# Patient Record
Sex: Female | Born: 1955 | Race: Black or African American | Hispanic: No | Marital: Single | Smoking: Former smoker
Health system: Southern US, Community
[De-identification: ages and names within clinical notes are randomized; demographics above are authoritative.]

---

## 2004-04-04 ENCOUNTER — Emergency Department: Payer: Self-pay | Admitting: Emergency Medicine

## 2004-04-07 ENCOUNTER — Ambulatory Visit: Payer: Self-pay | Admitting: Emergency Medicine

## 2004-09-08 ENCOUNTER — Emergency Department: Payer: Self-pay | Admitting: Emergency Medicine

## 2005-01-24 ENCOUNTER — Ambulatory Visit: Payer: Self-pay | Admitting: Family Medicine

## 2005-08-09 ENCOUNTER — Emergency Department: Payer: Self-pay | Admitting: Emergency Medicine

## 2006-03-28 ENCOUNTER — Ambulatory Visit: Payer: Self-pay | Admitting: Family Medicine

## 2007-05-30 ENCOUNTER — Ambulatory Visit: Payer: Self-pay | Admitting: Family Medicine

## 2007-06-06 ENCOUNTER — Emergency Department: Payer: Self-pay | Admitting: Emergency Medicine

## 2007-06-12 ENCOUNTER — Emergency Department: Payer: Self-pay | Admitting: Emergency Medicine

## 2007-07-18 ENCOUNTER — Emergency Department: Payer: Self-pay | Admitting: Emergency Medicine

## 2007-12-16 ENCOUNTER — Ambulatory Visit: Payer: Self-pay | Admitting: Gastroenterology

## 2008-06-17 ENCOUNTER — Ambulatory Visit: Payer: Self-pay | Admitting: Family Medicine

## 2009-03-13 ENCOUNTER — Emergency Department: Payer: Self-pay | Admitting: Emergency Medicine

## 2009-06-22 ENCOUNTER — Emergency Department: Payer: Self-pay | Admitting: Emergency Medicine

## 2009-07-21 ENCOUNTER — Ambulatory Visit: Payer: Self-pay | Admitting: Unknown Physician Specialty

## 2009-10-08 ENCOUNTER — Emergency Department: Payer: Self-pay | Admitting: Emergency Medicine

## 2010-07-25 ENCOUNTER — Ambulatory Visit: Payer: Self-pay | Admitting: Unknown Physician Specialty

## 2011-07-31 ENCOUNTER — Ambulatory Visit: Payer: Self-pay | Admitting: Internal Medicine

## 2011-09-19 ENCOUNTER — Emergency Department: Payer: Self-pay | Admitting: *Deleted

## 2012-07-20 ENCOUNTER — Emergency Department: Payer: Self-pay | Admitting: Emergency Medicine

## 2012-08-15 ENCOUNTER — Ambulatory Visit: Payer: Self-pay | Admitting: Physician Assistant

## 2013-08-18 ENCOUNTER — Ambulatory Visit: Payer: Self-pay | Admitting: Physician Assistant

## 2014-01-10 ENCOUNTER — Emergency Department: Payer: Self-pay | Admitting: Student

## 2014-05-27 ENCOUNTER — Emergency Department: Payer: Self-pay | Admitting: Emergency Medicine

## 2014-08-21 ENCOUNTER — Ambulatory Visit
Admit: 2014-08-21 | Disposition: A | Discharge status: Home / Self Care | Payer: Self-pay | Attending: Physician Assistant | Admitting: Physician Assistant

## 2015-06-14 ENCOUNTER — Other Ambulatory Visit: Payer: Self-pay | Admitting: Physician Assistant

## 2015-08-16 ENCOUNTER — Other Ambulatory Visit: Payer: Self-pay | Admitting: Physician Assistant

## 2015-08-16 DIAGNOSIS — Z1231 Encounter for screening mammogram for malignant neoplasm of breast: Secondary | ICD-10-CM

## 2015-08-24 ENCOUNTER — Ambulatory Visit
Admission: RE | Admit: 2015-08-24 | Discharge: 2015-08-24 | Disposition: A | Discharge status: Home / Self Care | Payer: BLUE CROSS/BLUE SHIELD | Source: Ambulatory Visit | Attending: Physician Assistant | Admitting: Physician Assistant

## 2015-08-24 DIAGNOSIS — Z1231 Encounter for screening mammogram for malignant neoplasm of breast: Secondary | ICD-10-CM

## 2016-03-27 DIAGNOSIS — R7303 Prediabetes: Secondary | ICD-10-CM | POA: Insufficient documentation

## 2016-07-25 ENCOUNTER — Ambulatory Visit: Payer: Self-pay | Admitting: *Deleted

## 2016-07-25 DIAGNOSIS — Z789 Other specified health status: Secondary | ICD-10-CM

## 2016-07-26 ENCOUNTER — Ambulatory Visit: Payer: Self-pay

## 2016-08-15 ENCOUNTER — Other Ambulatory Visit: Payer: Self-pay | Admitting: Physician Assistant

## 2016-08-15 DIAGNOSIS — Z1231 Encounter for screening mammogram for malignant neoplasm of breast: Secondary | ICD-10-CM

## 2016-09-01 ENCOUNTER — Ambulatory Visit
Admission: RE | Admit: 2016-09-01 | Discharge: 2016-09-01 | Disposition: A | Discharge status: Home / Self Care | Payer: BLUE CROSS/BLUE SHIELD | Source: Ambulatory Visit | Attending: Physician Assistant | Admitting: Physician Assistant

## 2016-09-01 DIAGNOSIS — Z1231 Encounter for screening mammogram for malignant neoplasm of breast: Secondary | ICD-10-CM | POA: Insufficient documentation

## 2017-03-27 ENCOUNTER — Ambulatory Visit: Payer: Self-pay | Admitting: Medical

## 2017-03-27 ENCOUNTER — Encounter: Payer: Self-pay | Admitting: Medical

## 2017-03-27 VITALS — BP 120/84 | HR 66 | Temp 98.4°F | Resp 16 | Ht 63.0 in | Wt 194.0 lb

## 2017-03-27 DIAGNOSIS — J0100 Acute maxillary sinusitis, unspecified: Secondary | ICD-10-CM

## 2017-03-27 DIAGNOSIS — J069 Acute upper respiratory infection, unspecified: Secondary | ICD-10-CM

## 2017-03-27 DIAGNOSIS — R05 Cough: Secondary | ICD-10-CM

## 2017-03-27 DIAGNOSIS — R059 Cough, unspecified: Secondary | ICD-10-CM

## 2017-03-27 MED ORDER — AMOXICILLIN-POT CLAVULANATE 875-125 MG PO TABS: 1.0000 | ORAL_TABLET | Freq: Two times a day (BID) | ORAL | 0 refills | Status: DC

## 2017-03-27 MED ORDER — BENZONATATE 100 MG PO CAPS: 100.0000 mg | ORAL_CAPSULE | Freq: Three times a day (TID) | ORAL | 0 refills | Status: DC | PRN

## 2017-03-27 NOTE — Progress Notes (Signed)
   Subjective:    Patient ID: Charlotte Tucker, female    DOB: 1955/05/30, 61 y.o.   MRN: [id number]  HPI 61 yo female in non acute distress started Monday with initially with sore throat and both ears clogged feeling Ears better now. Cougn now non productive. Denies fever or chills, Soreness in chest from coughing. Using sore throat lozengers no relief.    History of  Hypertension Low vitamin D   Review of Systems  Constitutional: Negative for chills, fatigue and fever.  HENT: Positive for congestion and rhinorrhea. Negative for ear pain, postnasal drip, sinus pressure, sinus pain, sneezing and sore throat.   Eyes: Negative for discharge and itching.  Respiratory: Positive for cough. Negative for chest tightness and shortness of breath.   Cardiovascular: Positive for chest pain (soreness from coughing). Negative for palpitations and leg swelling.  Gastrointestinal: Negative for abdominal pain.  Endocrine: Negative for cold intolerance and heat intolerance.  Genitourinary: Negative for dysuria.  Musculoskeletal: Negative for myalgias.  Skin: Negative for rash.  Allergic/Immunologic: Negative for environmental allergies and food allergies.  Neurological: Negative for dizziness, syncope, light-headedness and headaches.  Hematological: Negative for adenopathy.  Psychiatric/Behavioral: Negative for behavioral problems, self-injury and suicidal ideas. The patient is not nervous/anxious.        Objective:   Physical Exam  Constitutional: She is oriented to person, place, and time. She appears well-developed and well-nourished.  HENT:  Head: Normocephalic and atraumatic.  Right Ear: Hearing, external ear and ear canal normal. A middle ear effusion is present.  Left Ear: Hearing, external ear and ear canal normal. A middle ear effusion is present.  Nose: Mucosal edema and rhinorrhea present. Right sinus exhibits maxillary sinus tenderness. Left sinus exhibits maxillary sinus tenderness.   Mouth/Throat: Uvula is midline, oropharynx is clear and moist and mucous membranes are normal.  Eyes: Conjunctivae and EOM are normal. Pupils are equal, round, and reactive to light.  Neck: Normal range of motion. Neck supple.  Cardiovascular: Normal rate, regular rhythm and normal heart sounds. Exam reveals no gallop and no friction rub.  No murmur heard. Pulmonary/Chest: Effort normal and breath sounds normal. No respiratory distress. She has no wheezes. She has no rales. She exhibits tenderness.  Lymphadenopathy:    She has no cervical adenopathy.  Neurological: She is alert and oriented to person, place, and time.  Skin: Skin is warm and dry.  Psychiatric: She has a normal mood and affect. Her behavior is normal. Judgment and thought content normal.  Nursing note and vitals reviewed.   turbinate edema and erythema noted left more than right.  Cough noted in room    Assessment & Plan:  Sinusitis/ Upper respiratory infection / cough Meds ordered this encounter  Medications  . amoxicillin-clavulanate (AUGMENTIN) 875-125 MG tablet    Sig: Take 1 tablet by mouth 2 (two) times daily.    Dispense:  20 tablet    Refill:  0  . benzonatate (TESSALON PERLES) 100 MG capsule    Sig: Take 1 capsule (100 mg total) by mouth 3 (three) times daily as needed for cough.    Dispense:  30 capsule    Refill:  0  OTCfFonse take as directed.  Return in 3-5 days if not doing better.  Patient verbalizes understanding and had no questions at discharges.

## 2017-03-27 NOTE — Patient Instructions (Signed)
OTC Flonase take as directed. Cough, Adult A cough helps to clear your throat and lungs. A cough may last only 2-3 weeks (acute), or it may last longer than 8 weeks (chronic). Many different things can cause a cough. A cough may be a sign of an illness or another medical condition. Follow these instructions at home:  Pay attention to any changes in your cough.  Take medicines only as told by your doctor. ? If you were prescribed an antibiotic medicine, take it as told by your doctor. Do not stop taking it even if you start to feel better. ? Talk with your doctor before you try using a cough medicine.  Drink enough fluid to keep your pee (urine) clear or pale yellow.  If the air is dry, use a cold steam vaporizer or humidifier in your home.  Stay away from things that make you cough at work or at home.  If your cough is worse at night, try using extra pillows to raise your head up higher while you sleep.  Do not smoke, and try not to be around smoke. If you need help quitting, ask your doctor.  Do not have caffeine.  Do not drink alcohol.  Rest as needed. Contact a doctor if:  You have new problems (symptoms).  You cough up yellow fluid (pus).  Your cough does not get better after 2-3 weeks, or your cough gets worse.  Medicine does not help your cough and you are not sleeping well.  You have pain that gets worse or pain that is not helped with medicine.  You have a fever.  You are losing weight and you do not know why.  You have night sweats. Get help right away if:  You cough up blood.  You have trouble breathing.  Your heartbeat is very fast. This information is not intended to replace advice given to you by your health care provider. Make sure you discuss any questions you have with your health care provider. Document Released: 12/29/2010 Document Revised: 09/23/2015 Document Reviewed: 06/24/2014 Elsevier Interactive Patient Education  2018 [employer]vier  Inc[employer]  Upper Respiratory Infection, Adult Most upper respiratory infections (URIs) are caused by a virus. A URI affects the nose, throat, and upper air passages. The most common type of URI is often called "the common cold." Follow these instructions at home:  Take medicines only as told by your doctor.  Gargle warm saltwater or take cough drops to comfort your throat as told by your doctor.  Use a warm mist humidifier or inhale steam from a shower to increase air moisture. This may make it easier to breathe.  Drink enough fluid to keep your pee (urine) clear or pale yellow.  Eat soups and other clear broths.  Have a healthy diet.  Rest as needed.  Go back to work when your fever is gone or your doctor says it is okay. ? You may need to stay home longer to avoid giving your URI to others. ? You can also wear a face mask and wash your hands often to prevent spread of the virus.  Use your inhaler more if you have asthma.  Do not use any tobacco products, including cigarettes, chewing tobacco, or electronic cigarettes. If you need help quitting, ask your doctor. Contact a doctor if:  You are getting worse, not better.  Your symptoms are not helped by medicine.  You have chills.  You are getting more short of breath.  You have brown or red  mucus.  You have yellow or brown discharge from your nose.  You have pain in your face, especially when you bend forward.  You have a fever.  You have puffy (swollen) neck glands.  You have pain while swallowing.  You have white areas in the back of your throat. Get help right away if:  You have very bad or constant: ? Headache. ? Ear pain. ? Pain in your forehead, behind your eyes, and over your cheekbones (sinus pain). ? Chest pain.  You have long-lasting (chronic) lung disease and any of the following: ? Wheezing. ? Long-lasting cough. ? Coughing up blood. ? A change in your usual mucus.  You have a stiff neck.  You  have changes in your: ? Vision. ? Hearing. ? Thinking. ? Mood. This information is not intended to replace advice given to you by your health care provider. Make sure you discuss any questions you have with your health care provider. Document Released: 10/04/2007 Document Revised: 12/19/2015 Document Reviewed: 07/23/2013 Elsevier Interactive Patient Education  2018 [employer]    Sinusitis, Adult Sinusitis is soreness and inflammation of your sinuses. Sinuses are hollow spaces in the bones around your face. They are located:  Around your eyes.  In the middle of your forehead.  Behind your nose.  In your cheekbones.  Your sinuses and nasal passages are lined with a stringy fluid (mucus). Mucus normally drains out of your sinuses. When your nasal tissues get inflamed or swollen, the mucus can get trapped or blocked so air cannot flow through your sinuses. This lets bacteria, viruses, and funguses grow, and that leads to infection. Follow these instructions at home: Medicines  Take, use, or apply over-the-counter and prescription medicines only as told by your doctor. These may include nasal sprays.  If you were prescribed an antibiotic medicine, take it as told by your doctor. Do not stop taking the antibiotic even if you start to feel better. Hydrate and Humidify  Drink enough water to keep your pee (urine) clear or pale yellow.  Use a cool mist humidifier to keep the humidity level in your home above 50%.  Breathe in steam for 10-15 minutes, 3-4 times a day or as told by your doctor. You can do this in the bathroom while a hot shower is running.  Try not to spend time in cool or dry air. Rest  Rest as much as possible.  Sleep with your head raised (elevated).  Make sure to get enough sleep each night. General instructions  Put a warm, moist washcloth on your face 3-4 times a day or as told by your doctor. This will help with discomfort.  Wash your hands often with  soap and water. If there is no soap and water, use hand sanitizer.  Do not smoke. Avoid being around people who are smoking (secondhand smoke).  Keep all follow-up visits as told by your doctor. This is important. Contact a doctor if:  You have a fever.  Your symptoms get worse.  Your symptoms do not get better within 10 days. Get help right away if:  You have a very bad headache.  You cannot stop throwing up (vomiting).  You have pain or swelling around your face or eyes.  You have trouble seeing.  You feel confused.  Your neck is stiff.  You have trouble breathing. This information is not intended to replace advice given to you by your health care provider. Make sure you discuss any questions you have with  your health care provider. Document Released: 10/04/2007 Document Revised: 12/12/2015 Document Reviewed: 02/10/2015 Elsevier Interactive Patient Education  [employer].

## 2017-06-01 ENCOUNTER — Ambulatory Visit: Payer: Self-pay | Admitting: Adult Health

## 2017-06-01 ENCOUNTER — Encounter: Payer: Self-pay | Admitting: Adult Health

## 2017-06-01 VITALS — BP 110/70 | HR 94 | Temp 102.2°F | Resp 16 | Ht 64.0 in | Wt 191.0 lb

## 2017-06-01 DIAGNOSIS — E785 Hyperlipidemia, unspecified: Secondary | ICD-10-CM | POA: Insufficient documentation

## 2017-06-01 DIAGNOSIS — E559 Vitamin D deficiency, unspecified: Secondary | ICD-10-CM | POA: Insufficient documentation

## 2017-06-01 DIAGNOSIS — R6889 Other general symptoms and signs: Secondary | ICD-10-CM

## 2017-06-01 DIAGNOSIS — E669 Obesity, unspecified: Secondary | ICD-10-CM | POA: Insufficient documentation

## 2017-06-01 DIAGNOSIS — J101 Influenza due to other identified influenza virus with other respiratory manifestations: Secondary | ICD-10-CM

## 2017-06-01 DIAGNOSIS — I10 Essential (primary) hypertension: Secondary | ICD-10-CM | POA: Insufficient documentation

## 2017-06-01 LAB — POCT INFLUENZA A/B
INFLUENZA A, POC: POSITIVE — AB
Influenza B, POC: NEGATIVE

## 2017-06-01 MED ORDER — OSELTAMIVIR PHOSPHATE 75 MG PO CAPS: 75.0000 mg | ORAL_CAPSULE | Freq: Two times a day (BID) | ORAL | 0 refills | Status: DC

## 2017-06-01 MED ORDER — AZITHROMYCIN 250 MG PO TABS: ORAL_TABLET | ORAL | 0 refills | Status: DC

## 2017-06-01 NOTE — Progress Notes (Signed)
Subjective:     Patient ID: Charlotte Tucker, female   DOB: [birth year], 62 y.o.   MRN: [id number]  HPI  Vitals:   06/01/17 0906  BP: 110/70  Pulse: 94  Resp: 16  Temp: (!) 102.2 F (39 C)  SpO2: 96%   Patient is a 62 year old female in no acute distress who presents to the clinic with fever, chills, body aches, mild headache, cough x 2 days. She reports symptoms started out mild on Wednesday afternoon.  Fever started today per patient and symptoms were very mild on onset and has increased today. She did not work last pm. She reports other than the fatigue and fever she feels fine.  Febrile in office today.   She has a non productive occasional cough per her report.  Mild body generalized body aches.   Patient  denies any chest pain, shortness of breath, nausea, vomiting, or diarrhea.  Patient Active Problem List   Diagnosis Date Noted  . Hyperlipidemia, unspecified 06/01/2017  . Hypertension 06/01/2017  . Obesity, unspecified 06/01/2017  . Vitamin D deficiency, unspecified 06/01/2017  . Prediabetes 03/27/2016      Current Outpatient Medications:  .  aspirin EC 81 MG tablet, Take two tabs daily with food for cardiovascular prophylaxis., Disp: , Rfl:  .  losartan-hydrochlorothiazide (HYZAAR) 100-12.5 MG tablet, take 1 tablet by mouth once daily, Disp: , Rfl:  .  spironolactone (ALDACTONE) 25 MG tablet, take 1 tablet by mouth once daily, Disp: , Rfl:  .  Vitamin D, Ergocalciferol, (DRISDOL) 50000 units CAPS capsule, take 1 capsule by mouth every week, Disp: , Rfl:  .  ibuprofen (ADVIL,MOTRIN) 800 MG tablet, Take by mouth., Disp: , Rfl:   Review of Systems  Constitutional: Positive for chills, fatigue and fever. Negative for activity change, appetite change, diaphoresis and unexpected weight change.  HENT: Positive for congestion, postnasal drip and sneezing. Negative for dental problem, drooling, ear discharge, ear pain, facial swelling, hearing loss, mouth sores, nosebleeds,  rhinorrhea, sinus pressure, sinus pain, sore throat, tinnitus, trouble swallowing and voice change.   Eyes: Negative.   Respiratory: Negative for apnea, cough, choking, chest tightness, shortness of breath, wheezing and stridor.   Cardiovascular: Negative for chest pain, palpitations and leg swelling.  Gastrointestinal: Negative.   Endocrine: Negative.   Genitourinary: Negative.   Musculoskeletal: Negative.        Body aches   Skin: Negative.   Allergic/Immunologic: Negative.   Neurological: Negative.   Hematological: Negative.   Psychiatric/Behavioral: Negative.        Objective:   Physical Exam  Constitutional: She is oriented to person, place, and time. She appears well-developed and well-nourished. No distress.  HENT:  Head: Normocephalic and atraumatic.  Right Ear: External ear normal. No tenderness. Tympanic membrane is erythematous. Tympanic membrane is not scarred and not perforated. No middle ear effusion. No decreased hearing is noted.  Left Ear: External ear normal. No tenderness. Tympanic membrane is erythematous (mild bilaterally ). Tympanic membrane is not scarred and not perforated.  No middle ear effusion. No decreased hearing is noted.  Nose: Rhinorrhea present. No mucosal edema. Right sinus exhibits no maxillary sinus tenderness and no frontal sinus tenderness. Left sinus exhibits no maxillary sinus tenderness and no frontal sinus tenderness.  Mouth/Throat: Uvula is midline, oropharynx is clear and moist and mucous membranes are normal. No oropharyngeal exudate, posterior oropharyngeal edema, posterior oropharyngeal erythema or tonsillar abscesses.  Eyes: Conjunctivae, EOM and lids are normal. Pupils are equal, round, and reactive  to light. Right eye exhibits no discharge. Left eye exhibits no discharge. No scleral icterus.  Neck: Trachea normal, normal range of motion, full passive range of motion without pain and phonation normal. Neck supple. No JVD present. No neck  rigidity. No tracheal deviation, no edema, no erythema and normal range of motion present. No Brudzinski's sign and no Kernig's sign noted. No thyromegaly present.  Cardiovascular: Normal rate, regular rhythm, S1 normal, S2 normal, normal heart sounds and intact distal pulses. Exam reveals no gallop and no friction rub.  No murmur heard. Pulmonary/Chest: Effort normal and breath sounds normal. No stridor. No respiratory distress. She has no wheezes. She has no rales. She exhibits no tenderness.  Abdominal: Soft. Bowel sounds are normal.  Musculoskeletal: Normal range of motion.  Lymphadenopathy:    She has no cervical adenopathy.  Neurological: She is alert and oriented to person, place, and time. She displays normal reflexes. No cranial nerve deficit. She exhibits normal muscle tone. Coordination normal.  Skin: Skin is warm and dry. No rash noted. She is not diaphoretic. No erythema. No pallor.  Psychiatric: She has a normal mood and affect. Her behavior is normal. Judgment and thought content normal.  Vitals reviewed.      Assessment:     Flu-like symptoms - Plan: POCT Influenza A/B, CBC w/Diff, Comp Met (CMET)  Influenza A - Plan: CBC w/Diff, Comp Met (CMET)      Plan:     Patient requests Tamiflu.   Orders Placed This Encounter  Procedures  . CBC w/Diff  . Comp Met (CMET)  . POCT Influenza A/B   Will check CBC and CMET for baseline labs as none available to this provider at time of visit.  Flu A rapid POCT in office is positive. Flu B negative.   FLU AVS handout given and reviewed, discussed also possible side effects of Tamiflu and when to seek attention of medical facility.   Meds ordered this encounter  Medications  . azithromycin (ZITHROMAX) 250 MG tablet    Sig: Take 2 tablets day one and then one tablet day two three four and five    Dispense:  6 tablet    Refill:  0  . oseltamivir (TAMIFLU) 75 MG capsule    Sig: Take 1 capsule (75 mg total) by mouth 2 (two)  times daily.    Dispense:  10 capsule    Refill:  0   Discussed returning if any worsening symptoms or if after hours go to emergency will like if it was fluids that may remain for that  Advised to return to clinic for an appointment if no improvement within 72 hours or if any symptoms change or worsen. Advised ER or urgent Care if after hours or on weekend. 911 for emergency symptoms at any time.    Patient verbalized understanding of all instructions given and denies any further questions at this time.

## 2017-06-01 NOTE — Patient Instructions (Signed)

## 2017-06-02 LAB — COMPREHENSIVE METABOLIC PANEL
ALT: 17 IU/L (ref 0–32)
AST: 15 IU/L (ref 0–40)
Albumin/Globulin Ratio: 1.3 (ref 1.2–2.2)
Albumin: 4 g/dL (ref 3.5–5.5)
Alkaline Phosphatase: 72 IU/L (ref 39–117)
BUN/Creatinine Ratio: 10 (ref 9–23)
BUN: 14 mg/dL (ref 6–24)
Bilirubin Total: 0.3 mg/dL (ref 0.0–1.2)
CALCIUM: 9.1 mg/dL (ref 8.7–10.2)
CO2: 24 mmol/L (ref 20–29)
CREATININE: 1.37 mg/dL — AB (ref 0.57–1.00)
Chloride: 98 mmol/L (ref 96–106)
GFR calc Af Amer: 49 mL/min/{1.73_m2} — ABNORMAL LOW (ref >59)
GFR, EST NON AFRICAN AMERICAN: 42 mL/min/{1.73_m2} — AB (ref >59)
GLOBULIN, TOTAL: 3 g/dL (ref 1.5–4.5)
Glucose: 132 mg/dL — ABNORMAL HIGH (ref 65–99)
Potassium: 4.6 mmol/L (ref 3.5–5.2)
Sodium: 136 mmol/L (ref 134–144)
Total Protein: 7 g/dL (ref 6.0–8.5)

## 2017-06-02 LAB — CBC WITH DIFFERENTIAL/PLATELET
BASOS: 0 %
Basophils Absolute: 0 10*3/uL (ref 0.0–0.2)
EOS (ABSOLUTE): 0.1 10*3/uL (ref 0.0–0.4)
EOS: 1 %
HEMATOCRIT: 37.2 % (ref 34.0–46.6)
Hemoglobin: 12.2 g/dL (ref 11.1–15.9)
IMMATURE GRANS (ABS): 0 10*3/uL (ref 0.0–0.1)
IMMATURE GRANULOCYTES: 0 %
Lymphocytes Absolute: 0.9 10*3/uL (ref 0.7–3.1)
Lymphs: 11 %
MCH: 28.8 pg (ref 26.6–33.0)
MCHC: 32.8 g/dL (ref 31.5–35.7)
MCV: 88 fL (ref 79–97)
MONOCYTES: 10 %
Monocytes Absolute: 0.8 10*3/uL (ref 0.1–0.9)
NEUTROS PCT: 78 %
Neutrophils Absolute: 6.6 10*3/uL (ref 1.4–7.0)
Platelets: 290 10*3/uL (ref 150–379)
RBC: 4.23 x10E6/uL (ref 3.77–5.28)
RDW: 15.3 % (ref 12.3–15.4)
WBC: 8.5 10*3/uL (ref 3.4–10.8)

## 2017-06-03 ENCOUNTER — Telehealth: Payer: Self-pay | Admitting: Adult Health

## 2017-06-03 NOTE — Telephone Encounter (Signed)
06/03/2017 9:02pm   Patient returned call, she reports she is feeling better than she was on Friday. She reports she did have an episode where she tripped at home oin Saturday and her daughter called EMS, she reports she declined going to the hospital and denies any injuries. She reports she is up walking around today. Patient  denies chills, rash, chest pain, shortness of breath, nausea, vomiting, or diarrhea. She denies having a fever since Saturday.  She is informed of her decreased kidney function at this time.   She is advised to drink clear liquids to keep hydrated and she verbalizes understanding to stop Tamiflu. She reports she has not taken any Tamiflu since yesterday, She is drinking lots of Gatorade per her report.   She is advise to go to the ER - have daughter drive if any fever 191103 or higher, respiratory changes, chills, pain or any other symptoms develop or worsen.  Call 911 for any emergent symptoms.  Discussed kidney function and avoiding NSAIDS. She denies any history of decreased kidney function.  She verbalizes she will stop Tamiflu.   Discussed risk factors of flu and risk for infection, she is aware of signs of worsening symptoms and will go to the ER should this occur. She reports she is eating and drinking well.   She will call the office as needed  For an appointment during regular office hours and will follow up with the office or her Charlotte Tucker, Charlotte K, MD for repeat lab work to recheck kidney function within 2 weeks.   Patient verbalized understanding of all instructions given and denies any further questions at this time.

## 2017-06-03 NOTE — Progress Notes (Signed)
Provider received labs Sunday 8:17 pm and attempted to call patient- left generic message to return call to provider or call office in the morning. SHE WILL NEED TO DISCONTINUE TAMIFLU. She denied any history of kidney or liver dysfunction at time of visit  and requested  Tamiflu. Provider calling patient to discontinue Tamiflu due to decreased kidney function. GFR 49 Creatinine 1.37.  Elevated glucose she was not fasting. She will need repeat CMET in this office or at [facility], Miriam K, [facility]. Recommend clear fluids and hydration. Will send message to colleague in office to have nursing or provider to call patient in the am of 06/04/17.   Please try to reach patient on 06/03/17.

## 2017-06-03 NOTE — Progress Notes (Signed)
Spoke with patient  - see telephone call.

## 2017-06-07 ENCOUNTER — Telehealth: Payer: Self-pay | Admitting: Adult Health

## 2017-06-07 ENCOUNTER — Encounter: Payer: Self-pay | Admitting: Adult Health

## 2017-06-07 DIAGNOSIS — N289 Disorder of kidney and ureter, unspecified: Secondary | ICD-10-CM

## 2017-06-07 NOTE — Telephone Encounter (Signed)
06/07/2017 patient was called by provider as she had called this morning with questions on what she could take chest congestion, provider advised Mucinex only without decongestant over-the-counter per package instructions.  That she is now afebrile for the last 3 days and is feeling much come better than she was previously.  Improved tremendously and she is up and going out today.   She is advised to continue to hydrate with non caffeine  beverages.   Signs and symptoms of worsening infection are discussed with patient and she will call the office for an appointment or be seen after hours if the clinic is closed at any time if symptoms change or worsen including but not limited to fever, shortness of breath, chest pain, or any other worsening symptom.  Call 911 as needed  Patient reports that she did complete her azithromycin, she also reports that she stopped her Tamiflu as documented in previous phone call.  The patient is also aware of her decreased kidney function and denies having this in the past see documented telephone note this past Saturday.  She is advised to return to the clinic for a repeat within the next 2 weeks be sure that this has returned to normal and if labs return abnormal she will need to follow-up with her PCP Charlotte Tucker, Charlotte MillinerMiriam K, MD  Patient verbalized understanding of all instructions given and denies any further questions at this time.

## 2017-06-07 NOTE — Telephone Encounter (Signed)
Duplicate epic error. No 2nd call on 06/07/17.

## 2017-06-18 ENCOUNTER — Other Ambulatory Visit: Payer: Self-pay

## 2017-06-20 ENCOUNTER — Other Ambulatory Visit: Payer: Self-pay

## 2017-06-20 DIAGNOSIS — N289 Disorder of kidney and ureter, unspecified: Secondary | ICD-10-CM

## 2017-06-21 ENCOUNTER — Telehealth: Payer: Self-pay | Admitting: Adult Health

## 2017-06-21 LAB — COMPREHENSIVE METABOLIC PANEL
ALBUMIN: 3.9 g/dL (ref 3.5–5.5)
ALT: 17 IU/L (ref 0–32)
AST: 24 IU/L (ref 0–40)
Albumin/Globulin Ratio: 1.3 (ref 1.2–2.2)
Alkaline Phosphatase: 71 IU/L (ref 39–117)
BILIRUBIN TOTAL: 0.4 mg/dL (ref 0.0–1.2)
BUN / CREAT RATIO: 16 (ref 9–23)
BUN: 19 mg/dL (ref 6–24)
CHLORIDE: 102 mmol/L (ref 96–106)
CO2: 23 mmol/L (ref 20–29)
Calcium: 9.3 mg/dL (ref 8.7–10.2)
Creatinine, Ser: 1.16 mg/dL — ABNORMAL HIGH (ref 0.57–1.00)
GFR calc Af Amer: 60 mL/min/{1.73_m2} (ref >59)
GFR calc non Af Amer: 52 mL/min/{1.73_m2} — ABNORMAL LOW (ref >59)
Globulin, Total: 3 g/dL (ref 1.5–4.5)
Glucose: 89 mg/dL (ref 65–99)
POTASSIUM: 4.3 mmol/L (ref 3.5–5.2)
SODIUM: 138 mmol/L (ref 134–144)
Total Protein: 6.9 g/dL (ref 6.0–8.5)

## 2017-06-21 NOTE — Progress Notes (Signed)
Called patient 06/21/17 with lab results- this was a recheck on her creatinine after having the flu/ GFR for African american patient now within normal limits of 60/ and creatinine still mildly increased however improved is now 1.16. She is advised to follow up with her Patrice ParadiseMcLaughlin, Miriam K, MD for recheck as advised by her pcp or within three months. She denies any swelling or edema. Potassium  Is normal on double diuretic therapy and should be monitored closely. She has appointment with [facility] ParadiseMcLaughlin, Miriam K, MD March 4th and will follow up . She may come by the office to pick up a copy of her most recent labs to take with her to this appointment. She will be sure to remain hydrated. Patient verbalized understanding of all instructions given and denies any further questions at this time.

## 2017-06-21 NOTE — Telephone Encounter (Signed)
A user error has taken place- epic error .

## 2017-07-10 ENCOUNTER — Ambulatory Visit: Payer: Self-pay | Admitting: Medical

## 2017-07-18 ENCOUNTER — Ambulatory Visit: Payer: Self-pay | Admitting: Medical

## 2017-07-23 ENCOUNTER — Ambulatory Visit: Payer: Self-pay | Admitting: Medical

## 2017-07-24 ENCOUNTER — Ambulatory Visit: Payer: Self-pay | Admitting: Medical

## 2017-07-30 ENCOUNTER — Ambulatory Visit: Payer: Self-pay | Admitting: Medical

## 2017-07-30 VITALS — BP 135/81 | HR 60 | Temp 97.2°F | Resp 16 | Ht 63.0 in | Wt 195.8 lb

## 2017-07-30 DIAGNOSIS — Y9301 Activity, walking, marching and hiking: Secondary | ICD-10-CM

## 2017-07-30 NOTE — Progress Notes (Signed)
   Subjective:    Patient ID: Charlotte Tucker, female    DOB: 04/24/1956, 62 y.o.   MRN: [id number]  HPI 62 yo female in non acute distress. Comes in to pick up pedometer.  Last seen on 06/01/17 with flu like symptoms and these have now all resolved.  Review of Systems  Constitutional: Negative for chills and fever.  HENT: Negative for congestion, ear pain and sore throat.   Eyes: Negative for discharge and itching.  Respiratory: Negative for cough and shortness of breath.   Cardiovascular: Negative for chest pain.  Gastrointestinal: Negative for abdominal pain.  Endocrine: Negative for polydipsia, polyphagia and polyuria.  Genitourinary: Negative for dysuria.  Musculoskeletal: Negative for myalgias.  Skin: Negative for rash.  Allergic/Immunologic: Negative for environmental allergies and food allergies.  Neurological: Negative for dizziness, syncope and light-headedness.  Hematological: Negative for adenopathy.  Psychiatric/Behavioral: Negative for behavioral problems, self-injury and suicidal ideas. The patient is not nervous/anxious.        Objective:   Physical Exam  Constitutional: She appears well-developed and well-nourished.  Eyes: Pupils are equal, round, and reactive to light. Conjunctivae and EOM are normal.  Cardiovascular: Normal rate, regular rhythm and normal heart sounds.  Pulmonary/Chest: Effort normal and breath sounds normal.  Skin: Skin is warm and dry.  Psychiatric: She has a normal mood and affect. Her behavior is normal. Judgment and thought content normal.  Nursing note and vitals reviewed.         Assessment & Plan:  Walking exercise program information given to patient. To start slowly and gradually increase. Patient will pickup pedometer tomorrow.  Has seen dietitian in clinic. Her primary doctor is Dr. Merlinda FrederickMcLaughlin.

## 2017-07-30 NOTE — Patient Instructions (Signed)
WALKING  Walking is a great form of exercise to increase your strength, endurance and overall fitness.  A walking program can help you start slowly and gradually build endurance as you go.  Everyone's ability is different, so each person's starting point will be different.  You do not have to follow them exactly.  The are just samples. You should simply find out what's right for you and stick to that program.   In the beginning, you'll start off walking 2-3 times a day for short distances.  As you get stronger, you'll be walking further at just 1-2 times per day.  A. You Can Walk For A Certain Length Of Time Each Day    Walk 5 minutes 3 times per day.  Increase 2 minutes every 2 days (3 times per day).  Work up to 25-30 minutes (1-2 times per day).   Example:   Day 1-2 5 minutes 3 times per day   Day 7-8 12 minutes 2-3 times per day   Day 13-14 25 minutes 1-2 times per day  B. You Can Walk For a Certain Distance Each Day     Distance can be substituted for time. Exercising to Lose Weight Exercising can help you to lose weight. In order to lose weight through exercise, you need to do vigorous-intensity exercise. You can tell that you are exercising with vigorous intensity if you are breathing very hard and fast and cannot hold a conversation while exercising. Moderate-intensity exercise helps to maintain your current weight. You can tell that you are exercising at a moderate level if you have a higher heart rate and faster breathing, but you are still able to hold a conversation. How often should I exercise? Choose an activity that you enjoy and set realistic goals. Your health care provider can help you to make an activity plan that works for you. Exercise regularly as directed by your health care provider. This may include: Doing resistance training twice each week, such as: Push-ups. Sit-ups. Lifting weights. Using resistance bands. Doing a given intensity of exercise for a given  amount of time. Choose from these options: 150 minutes of moderate-intensity exercise every week. 75 minutes of vigorous-intensity exercise every week. A mix of moderate-intensity and vigorous-intensity exercise every week.  Children, pregnant women, people who are out of shape, people who are overweight, and older adults may need to consult a health care provider for individual recommendations. If you have any sort of medical condition, be sure to consult your health care provider before starting a new exercise program. What are some activities that can help me to lose weight? Walking at a rate of at least 4.5 miles an hour. Jogging or running at a rate of 5 miles per hour. Biking at a rate of at least 10 miles per hour. Lap swimming. Roller-skating or in-line skating. Cross-country skiing. Vigorous competitive sports, such as football, basketball, and soccer. Jumping rope. Aerobic dancing. How can I be more active in my day-to-day activities? Use the stairs instead of the elevator. Take a walk during your lunch break. If you drive, park your car farther away from work or school. If you take public transportation, get off one stop early and walk the rest of the way. Make all of your phone calls while standing up and walking around. Get up, stretch, and walk around every 30 minutes throughout the day. What guidelines should I follow while exercising? Do not exercise so much that you hurt yourself, feel dizzy, or get  very short of breath. Consult your health care provider prior to starting a new exercise program. Wear comfortable clothes and shoes with good support. Drink plenty of water while you exercise to prevent dehydration or heat stroke. Body water is lost during exercise and must be replaced. Work out until you breathe faster and your heart beats faster. This information is not intended to replace advice given to you by your health care provider. Make sure you discuss any  questions you have with your health care provider. Document Released: 05/20/2010 Document Revised: 09/23/2015 Document Reviewed: 09/18/2013 Elsevier Interactive Patient Education  2018 [employer].  Exercising to [employer] Exercising can help you to lose weight. In order to lose weight through exercise, you need to do vigorous-intensity exercise. You can tell that you are exercising with vigorous intensity if you are breathing very hard and fast and cannot hold a conversation while exercising. Moderate-intensity exercise helps to maintain your current weight. You can tell that you are exercising at a moderate level if you have a higher heart rate and faster breathing, but you are still able to hold a conversation. How often should I exercise? Choose an activity that you enjoy and set realistic goals. Your health care provider can help you to make an activity plan that works for you. Exercise regularly as directed by your health care provider. This may include:  Doing resistance training twice each week, such as: ? Push-ups. ? Sit-ups. ? Lifting weights. ? Using resistance bands.  Doing a given intensity of exercise for a given amount of time. Choose from these options: ? 150 minutes of moderate-intensity exercise every week. ? 75 minutes of vigorous-intensity exercise every week. ? A mix of moderate-intensity and vigorous-intensity exercise every week.  Children, pregnant women, people who are out of shape, people who are overweight, and older adults may need to consult a health care provider for individual recommendations. If you have any sort of medical condition, be sure to consult your health care provider before starting a new exercise program. What are some activities that can help me to lose weight?  Walking at a rate of at least 4.5 miles an hour.  Jogging or running at a rate of 5 miles per hour.  Biking at a rate of at least 10 miles per hour.  Lap  swimming.  Roller-skating or in-line skating.  Cross-country skiing.  Vigorous competitive sports, such as football, basketball, and soccer.  Jumping rope.  Aerobic dancing. How can I be more active in my day-to-day activities?  Use the stairs instead of the elevator.  Take a walk during your lunch break.  If you drive, park your car farther away from work or school.  If you take public transportation, get off one stop early and walk the rest of the way.  Make all of your phone calls while standing up and walking around.  Get up, stretch, and walk around every 30 minutes throughout the day. What guidelines should I follow while exercising?  Do not exercise so much that you hurt yourself, feel dizzy, or get very short of breath.  Consult your health care provider prior to starting a new exercise program.  Wear comfortable clothes and shoes with good support.  Drink plenty of water while you exercise to prevent dehydration or heat stroke. Body water is lost during exercise and must be replaced.  Work out until you breathe faster and your heart beats faster. This information is not intended to replace advice given to  you by your health care provider. Make sure you discuss any questions you have with your health care provider. Document Released: 05/20/2010 Document Revised: 09/23/2015 Document Reviewed: 09/18/2013 Elsevier Interactive Patient Education  2018 [employer]     Example:   3 trips to mailbox (at road)   3 trips to corner of block   3 trips around the block  C. Go to local [employer] and use the track.    Walk for distance 1-2  around track  Or time 30 minutes  D. Walk 1-2 miles Jog zero  Run zero  Please only do the exercises that your therapist has initialed and dated

## 2017-07-31 ENCOUNTER — Ambulatory Visit: Payer: Self-pay | Admitting: Medical

## 2017-07-31 ENCOUNTER — Telehealth: Payer: Self-pay | Admitting: *Deleted

## 2017-09-03 ENCOUNTER — Other Ambulatory Visit: Payer: Self-pay | Admitting: Physician Assistant

## 2017-09-03 DIAGNOSIS — Z1231 Encounter for screening mammogram for malignant neoplasm of breast: Secondary | ICD-10-CM

## 2017-09-04 ENCOUNTER — Ambulatory Visit
Admission: RE | Admit: 2017-09-04 | Discharge: 2017-09-04 | Disposition: A | Discharge status: Home / Self Care | Payer: BLUE CROSS/BLUE SHIELD | Source: Ambulatory Visit | Attending: Physician Assistant | Admitting: Physician Assistant

## 2017-09-04 DIAGNOSIS — Z1231 Encounter for screening mammogram for malignant neoplasm of breast: Secondary | ICD-10-CM | POA: Diagnosis present

## 2018-05-08 ENCOUNTER — Encounter: Payer: Self-pay | Admitting: Medical

## 2018-05-08 ENCOUNTER — Ambulatory Visit: Payer: Self-pay | Admitting: Medical

## 2018-05-08 ENCOUNTER — Ambulatory Visit: Payer: Self-pay | Admitting: Registered Nurse

## 2018-05-08 VITALS — BP 116/61 | HR 74 | Temp 98.7°F | Resp 18 | Wt 201.0 lb

## 2018-05-08 DIAGNOSIS — B9789 Other viral agents as the cause of diseases classified elsewhere: Principal | ICD-10-CM

## 2018-05-08 DIAGNOSIS — J069 Acute upper respiratory infection, unspecified: Secondary | ICD-10-CM

## 2018-05-08 MED ORDER — BENZONATATE 200 MG PO CAPS: 200.0000 mg | ORAL_CAPSULE | Freq: Three times a day (TID) | ORAL | 0 refills | Status: DC | PRN

## 2018-05-08 MED ORDER — BENZONATATE 200 MG PO CAPS: 200.0000 mg | ORAL_CAPSULE | Freq: Three times a day (TID) | ORAL | 0 refills | Status: AC | PRN

## 2018-05-08 MED ORDER — AMOXICILLIN-POT CLAVULANATE 875-125 MG PO TABS: 1.0000 | ORAL_TABLET | Freq: Two times a day (BID) | ORAL | 0 refills | Status: AC

## 2018-05-08 MED ORDER — FLUTICASONE PROPIONATE 50 MCG/ACT NA SUSP: 1.0000 | Freq: Every day | NASAL | 1 refills | Status: AC

## 2018-05-08 MED ORDER — SALINE SPRAY 0.65 % NA SOLN: 2.0000 | NASAL | 0 refills | Status: AC

## 2018-05-08 NOTE — Progress Notes (Signed)
Subjective:    Patient ID: Charlotte Tucker, female    DOB: 07-04-1955, 63 y.o.   MRN: [id number]  62y/o african [place] female established patient with cough x 2 days nonproductive, rhinitis.  Denied fever/chills/n/v/d/myalgias/sore throat/wheezing.  Tried alka seltzer cough and cold without relief of cough  This does not feel like the flu she had last year.  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     Review of Systems  Constitutional: Negative for activity change, appetite change, chills, diaphoresis, fatigue, fever and unexpected weight change.  HENT: Positive for congestion, ear pain and rhinorrhea. Negative for dental problem, drooling, ear discharge, facial swelling, hearing loss, mouth sores, nosebleeds, postnasal drip, sinus pressure, sinus pain, sneezing, sore throat, tinnitus, trouble swallowing and voice change.   Eyes: Negative for photophobia, pain, discharge, redness, itching and visual disturbance.  Respiratory: Positive for cough. Negative for choking, chest tightness, shortness of breath, wheezing and stridor.   Cardiovascular: Negative for chest pain, palpitations and leg swelling.  Gastrointestinal: Negative for abdominal distention, abdominal pain, diarrhea, nausea and vomiting.  Endocrine: Negative for cold intolerance and heat intolerance.  Genitourinary: Negative for difficulty urinating, dysuria and hematuria.  Musculoskeletal: Negative for arthralgias, back pain, gait problem, joint swelling, myalgias, neck pain and neck stiffness.  Skin: Negative for color change, pallor, rash and wound.  Allergic/Immunologic: Positive for immunocompromised state. Negative for environmental allergies and food allergies.  Neurological: Negative for dizziness, tremors, seizures, syncope, facial asymmetry, speech difficulty, weakness, light-headedness, numbness and headaches.  Hematological: Negative for adenopathy. Does not  bruise/bleed easily.  Psychiatric/Behavioral: Negative for agitation, behavioral problems, confusion and sleep disturbance.       Objective:   Physical Exam Vitals signs and nursing note reviewed.  Constitutional:      General: She is not in acute distress.    Appearance: She is well-developed and well-groomed. She is obese. She is ill-appearing. She is not toxic-appearing or diaphoretic.  HENT:     Head: Normocephalic and atraumatic.     Jaw: There is normal jaw occlusion. No trismus.     Salivary Glands: Right salivary gland is not diffusely enlarged or tender. Left salivary gland is not diffusely enlarged or tender.     Right Ear: Hearing, ear canal and external ear normal. A middle ear effusion is present.     Left Ear: Hearing, ear canal and external ear normal. A middle ear effusion is present.     Nose: Mucosal edema and rhinorrhea present. No nasal deformity, septal deviation, laceration or congestion.     Right Turbinates: Enlarged and swollen. Not pale.     Left Turbinates: Enlarged and swollen. Not pale.     Right Sinus: No maxillary sinus tenderness or frontal sinus tenderness.     Left Sinus: No maxillary sinus tenderness or frontal sinus tenderness.     Mouth/Throat:     Lips: Pink. No lesions.     Mouth: Mucous membranes are moist. Mucous membranes are not pale, not dry and not cyanotic. No injury, lacerations, oral lesions or angioedema.     Dentition: Normal dentition. Does not have dentures. No dental caries or dental abscesses.     Tongue: No lesions.     Pharynx: Uvula midline. Pharyngeal swelling and posterior oropharyngeal erythema present. No oropharyngeal exudate or uvula swelling.     Tonsils: No tonsillar exudate or tonsillar abscesses. Swelling: 0 on the right. 0 on the left.     Comments:  Bilateral TMs air fluid level clear; cobblestoning posterior pharynx; bilateral allergic shiners; nasal turbinates edema/erythema clear yellow discharge nares Eyes:      General: Lids are normal. Allergic shiner present. No scleral icterus.       Right eye: No foreign body, discharge or hordeolum.        Left eye: No foreign body, discharge or hordeolum.     Extraocular Movements: Extraocular movements intact.     Right eye: Normal extraocular motion and no nystagmus.     Left eye: Normal extraocular motion and no nystagmus.     Conjunctiva/sclera: Conjunctivae normal.     Right eye: Right conjunctiva is not injected. No chemosis, exudate or hemorrhage.    Left eye: Left conjunctiva is not injected. No chemosis, exudate or hemorrhage.    Pupils: Pupils are equal, round, and reactive to light. Pupils are equal.     Right eye: Pupil is round and reactive.     Left eye: Pupil is round and reactive.  Neck:     Musculoskeletal: Normal range of motion and neck supple. Normal range of motion. No edema, erythema, neck rigidity, spinous process tenderness or muscular tenderness.     Thyroid: No thyroid mass or thyromegaly.     Trachea: Trachea and phonation normal. No tracheal tenderness or tracheal deviation.  Cardiovascular:     Rate and Rhythm: Normal rate and regular rhythm.     Chest Wall: PMI is not displaced.     Heart sounds: Normal heart sounds, S1 normal and S2 normal. No murmur. No friction rub. No gallop.   Pulmonary:     Effort: Pulmonary effort is normal. No accessory muscle usage or respiratory distress.     Breath sounds: Normal breath sounds and air entry. No stridor, decreased air movement or transmitted upper airway sounds. No decreased breath sounds, wheezing, rhonchi or rales.     Comments: Intermittent mild nonproductive cough in exam room; spoke full sentences without difficulty Chest:     Chest wall: No tenderness.  Abdominal:     General: There is no distension.     Palpations: Abdomen is soft.  Musculoskeletal: Normal range of motion.        General: No tenderness.     Right shoulder: Normal.     Left shoulder: Normal.     Right  elbow: Normal.    Left elbow: Normal.     Right hip: Normal.     Left hip: Normal.     Right knee: Normal.     Left knee: Normal.     Cervical back: Normal.     Thoracic back: Normal.     Lumbar back: Normal.     Right hand: Normal.     Left hand: Normal.     Right lower leg: No edema.     Left lower leg: No edema.  Lymphadenopathy:     Head:     Right side of head: No submental, submandibular, tonsillar, preauricular, posterior auricular or occipital adenopathy.     Left side of head: No submental, submandibular, tonsillar, preauricular, posterior auricular or occipital adenopathy.     Cervical: No cervical adenopathy.     Right cervical: No superficial, deep or posterior cervical adenopathy.    Left cervical: No superficial, deep or posterior cervical adenopathy.  Skin:    General: Skin is warm and dry.     Capillary Refill: Capillary refill takes less than 2 seconds.     Coloration: Skin is not ashen, cyanotic,  jaundiced, pale or sallow.     Findings: No abrasion, bruising, burn, ecchymosis, erythema, laceration, lesion, petechiae or rash.     Nails: There is no clubbing.   Neurological:     Mental Status: She is alert and oriented to person, place, and time. She is not disoriented.     GCS: GCS eye subscore is 4. GCS verbal subscore is 5. GCS motor subscore is 6.     Cranial Nerves: Cranial nerves are intact. No cranial nerve deficit.     Sensory: Sensation is intact. No sensory deficit.     Motor: Motor function is intact. No weakness, tremor, atrophy, abnormal muscle tone or seizure activity.     Coordination: Coordination is intact. Coordination normal.     Gait: Gait is intact. Gait normal.     Comments: On/off exam table and in/out of chair without difficulty; gait sure and steady in hallway  Psychiatric:        Attention and Perception: Attention and perception normal.        Mood and Affect: Mood and affect normal.        Speech: Speech normal.        Behavior:  Behavior normal. Behavior is cooperative.        Thought Content: Thought content normal.        Cognition and Memory: Cognition and memory normal.        Judgment: Judgment normal.           Assessment & Plan:  A-acute viral illness with cough  P-Patient refused work note stated she missed a shift earlier this week due to not feeling well.  May continue alka seltzer cough and cold OTC per manufacturer's instructions. Hydrate with water avoid dehydration. Electronic Rx tessalon pearles 200mg  po TID prn cough #30 Rf0.  tylenol 1000mg  po QID prn pain/fever.  If fever/body aches occurs call and notify clinic especially if she feels like flu again.  GFR normal could take tamiflu.  Electronic Rx augmentin 875mg  po BID x 10 days #20 RF0 to her pharmacy of choice start if upper teeth pain, opaque nasal discharge, frontal headache, ear discharge/ear pain as slight erythema peripheral noted today.  Symptoms should improve with flonase/saline nasal and tessalon pearles use x 48 hours but may not resolve completely  Patient may use normal saline nasal spray 2 sprays each nostril q2h wa as needed. flonase 50mcg 1 spray each nostril BID #1 RF1.  Has used in the past with good   Patient denied personal or family history of ENT cancer. Discussed wearing mask at work may help filter dust decrease cough and runny nose/irritation.  Discussed course of illness typically 10-14 days seen in community at this time with viral URI.  Hydrate honey with lemon/salty soup broths may help to ease the throat.Call or return to clinic as needed if these symptoms worsen or fail to improve as anticipated.   Exitcare handout on viral URI and sinus rinse given to patient.  Patient verbalized understanding of instructions, agreed with plan of care and had no further questions at this time.  P2:  Avoidance and hand washing.

## 2018-05-08 NOTE — Patient Instructions (Signed)
How to Perform a Sinus Rinse A sinus rinse is a home treatment that is used to rinse your sinuses with a sterile mixture of salt and water (saline solution). Sinuses are air-filled spaces in your skull behind the bones of your face and forehead that open into your nasal cavity. A sinus rinse can help to clear mucus, dirt, dust, or pollen from your nasal cavity. You may do a sinus rinse when you have a cold, a virus, nasal allergy symptoms, a sinus infection, or stuffiness in your nose or sinuses. Talk with your health care provider about whether a sinus rinse might help you. What are the risks? A sinus rinse is generally safe and effective. However, there are a few risks, which include:  A burning sensation in your sinuses. This may happen if you do not make the saline solution as directed. Be sure to follow all directions when making the saline solution.  Nasal irritation.  Infection from contaminated water. This is rare, but possible. Do not do a sinus rinse if you have had ear or nasal surgery, ear infection, or blocked ears. Supplies needed:  Saline solution or powder.  Distilled or sterile water may be needed to mix with saline powder. ? You may use boiled and cooled tap water. Boil tap water for 5 minutes; cool until it is lukewarm. Use within 24 hours. ? Do not use regular tap water to mix with the saline solution.  Neti pot or nasal rinse bottle. These supplies release the saline solution into your nose and through your sinuses. Neti pots and nasal rinse bottles can be purchased at [employer], a health food store, or online. How to perform a sinus rinse  1. Wash your hands with soap and water. 2. Wash your device according to the directions that came with the product and then dry it. 3. Use the solution that comes with your product or one that is sold separately in stores. Follow the mixing directions on the package if you need to mix with sterile or distilled  water. 4. Fill the device with the amount of saline solution noted in the device instructions. 5. Stand over a sink and tilt your head sideways over the sink. 6. Place the spout of the device in your upper nostril (the one closer to the ceiling). 7. Gently pour or squeeze the saline solution into your nasal cavity. The liquid should drain out from the lower nostril if you are not too congested. 8. While rinsing, breathe through your open mouth. 9. Gently blow your nose to clear any mucus and rinse solution. Blowing too hard may cause ear pain. 10. Repeat in your other nostril. 11. Clean and rinse your device with clean water and then air-dry it. Talk with your health care provider or pharmacist if you have questions about how to do a sinus rinse. Summary  A sinus rinse is a home treatment that is used to rinse your sinuses with a sterile mixture of salt and water (saline solution).  A sinus rinse is generally safe and effective. Follow all instructions carefully.  Before doing a sinus rinse, talk with your health care provider about whether it would be helpful for you. This information is not intended to replace advice given to you by your health care provider. Make sure you discuss any questions you have with your health care provider. Document Released: 11/12/2013 Document Revised: 02/12/2017 Document Reviewed: 02/12/2017 Elsevier Interactive Patient Education  2019 [employer]. Viral Respiratory Infection A respiratory  infection is an illness that affects part of the respiratory system, such as the lungs, nose, or throat. A respiratory infection that is caused by a virus is called a viral respiratory infection. Common types of viral respiratory infections include:  A cold.  The flu (influenza).  A respiratory syncytial virus (RSV) infection. What are the causes? This condition is caused by a virus. What are the signs or symptoms? Symptoms of this condition include:  A stuffy or  runny nose.  Yellow or green nasal discharge.  A cough.  Sneezing.  Fatigue.  Achy muscles.  A sore throat.  Sweating or chills.  A fever.  A headache. How is this diagnosed? This condition may be diagnosed based on:  Your symptoms.  A physical exam.  Testing of nasal swabs. How is this treated? This condition may be treated with medicines, such as:  Antiviral medicine. This may shorten the length of time a person has symptoms.  Expectorants. These make it easier to cough up mucus.  Decongestant nasal sprays.  Acetaminophen or NSAIDs to relieve fever and pain. Antibiotic medicines are not prescribed for viral infections. This is because antibiotics are designed to kill bacteria. They are not effective against viruses. Follow these instructions at home:  Managing pain and congestion  Take over-the-counter and prescription medicines only as told by your health care provider.  If you have a sore throat, gargle with a salt-water mixture 3-4 times a day or as needed. To make a salt-water mixture, completely dissolve -1 tsp of salt in 1 cup of warm water.  Use nose drops made from salt water to ease congestion and soften raw skin around your nose.  Drink enough fluid to keep your urine pale yellow. This helps prevent dehydration and helps loosen up mucus. General instructions  Rest as much as possible.  Do not drink alcohol.  Do not use any products that contain nicotine or tobacco, such as cigarettes and e-cigarettes. If you need help quitting, ask your health care provider.  Keep all follow-up visits as told by your health care provider. This is important. How is this prevented?   Get an annual flu shot. You may get the flu shot in late summer, fall, or winter. Ask your health care provider when you should get your flu shot.  Avoid exposing others to your respiratory infection. ? Stay home from work or school as told by your health care provider. ? Wash  your hands with soap and water often, especially after you cough or sneeze. If soap and water are not available, use alcohol-based hand sanitizer.  Avoid contact with people who are sick during cold and flu season. This is generally fall and winter. Contact a health care provider if:  Your symptoms last for 10 days or longer.  Your symptoms get worse over time.  You have a fever.  You have severe sinus pain in your face or forehead.  The glands in your jaw or neck become very swollen. Get help right away if you:  Feel pain or pressure in your chest.  Have shortness of breath.  Faint or feel like you will faint.  Have severe and persistent vomiting.  Feel confused or disoriented. Summary  A respiratory infection is an illness that affects part of the respiratory system, such as the lungs, nose, or throat. A respiratory infection that is caused by a virus is called a viral respiratory infection.  Common types of viral respiratory infections are a cold, influenza, and respiratory  syncytial virus (RSV) infection.  Symptoms of this condition include a stuffy or runny nose, cough, sneezing, fatigue, achy muscles, sore throat, and fevers or chills.  Antibiotic medicines are not prescribed for viral infections. This is because antibiotics are designed to kill bacteria. They are not effective against viruses. This information is not intended to replace advice given to you by your health care provider. Make sure you discuss any questions you have with your health care provider. Document Released: 01/25/2005 Document Revised: 05/28/2017 Document Reviewed: 05/28/2017 Elsevier Interactive Patient Education  2019 [employer].

## 2018-05-15 ENCOUNTER — Telehealth: Payer: Self-pay | Admitting: *Deleted

## 2018-05-15 NOTE — Telephone Encounter (Signed)
See phone note

## 2018-08-21 ENCOUNTER — Other Ambulatory Visit: Payer: Self-pay | Admitting: Physician Assistant

## 2018-08-21 DIAGNOSIS — Z1231 Encounter for screening mammogram for malignant neoplasm of breast: Secondary | ICD-10-CM

## 2018-10-28 ENCOUNTER — Other Ambulatory Visit: Payer: Self-pay | Admitting: Physician Assistant

## 2018-10-28 DIAGNOSIS — M542 Cervicalgia: Secondary | ICD-10-CM

## 2018-10-28 DIAGNOSIS — M5030 Other cervical disc degeneration, unspecified cervical region: Secondary | ICD-10-CM

## 2018-10-28 DIAGNOSIS — R202 Paresthesia of skin: Secondary | ICD-10-CM

## 2018-10-31 ENCOUNTER — Ambulatory Visit
Admission: RE | Admit: 2018-10-31 | Discharge: 2018-10-31 | Disposition: A | Discharge status: Home / Self Care | Payer: BC Managed Care – PPO | Source: Ambulatory Visit | Attending: Physician Assistant | Admitting: Physician Assistant

## 2018-10-31 ENCOUNTER — Other Ambulatory Visit: Payer: Self-pay

## 2018-10-31 DIAGNOSIS — M542 Cervicalgia: Secondary | ICD-10-CM | POA: Diagnosis present

## 2018-10-31 DIAGNOSIS — R202 Paresthesia of skin: Secondary | ICD-10-CM | POA: Insufficient documentation

## 2018-10-31 DIAGNOSIS — M5030 Other cervical disc degeneration, unspecified cervical region: Secondary | ICD-10-CM | POA: Diagnosis present

## 2018-11-19 ENCOUNTER — Ambulatory Visit
Admission: RE | Admit: 2018-11-19 | Discharge: 2018-11-19 | Disposition: A | Discharge status: Home / Self Care | Payer: BC Managed Care – PPO | Source: Ambulatory Visit | Attending: Physician Assistant | Admitting: Physician Assistant

## 2018-11-19 ENCOUNTER — Other Ambulatory Visit: Payer: Self-pay

## 2018-11-19 DIAGNOSIS — Z1231 Encounter for screening mammogram for malignant neoplasm of breast: Secondary | ICD-10-CM | POA: Diagnosis not present

## 2019-04-07 ENCOUNTER — Other Ambulatory Visit: Payer: Self-pay

## 2019-04-07 DIAGNOSIS — Z20822 Contact with and (suspected) exposure to covid-19: Secondary | ICD-10-CM

## 2019-04-08 LAB — NOVEL CORONAVIRUS, NAA: SARS-CoV-2, NAA: DETECTED — AB

## 2019-04-09 ENCOUNTER — Telehealth: Payer: Self-pay | Admitting: Nurse Practitioner

## 2019-04-09 NOTE — Telephone Encounter (Signed)
Called to Discuss with patient about Covid symptoms and the use of bamlanivimab, a monoclonal antibody infusion for those with mild to moderate Covid symptoms and at a high risk of hospitalization.     Pt is qualified for this infusion at the [facility] infusion center due to co-morbid conditions and/or a member of an at-risk group.    Patient is being managed for the following:  Patient Active Problem List   Diagnosis Date Noted  . Hyperlipidemia, unspecified 06/01/2017  . Hypertension 06/01/2017  . Obesity, unspecified 06/01/2017  . Vitamin D deficiency, unspecified 06/01/2017  . Prediabetes 03/27/2016      Patient would like to think about it and call back if she decides to proceed with the infusion.

## 2019-08-04 ENCOUNTER — Other Ambulatory Visit: Payer: Self-pay | Admitting: Physician Assistant

## 2019-08-04 DIAGNOSIS — Z1231 Encounter for screening mammogram for malignant neoplasm of breast: Secondary | ICD-10-CM

## 2019-11-26 ENCOUNTER — Ambulatory Visit
Admission: RE | Admit: 2019-11-26 | Discharge: 2019-11-26 | Disposition: A | Discharge status: Home / Self Care | Payer: BC Managed Care – PPO | Source: Ambulatory Visit | Attending: Physician Assistant | Admitting: Physician Assistant

## 2019-11-26 DIAGNOSIS — Z1231 Encounter for screening mammogram for malignant neoplasm of breast: Secondary | ICD-10-CM | POA: Diagnosis not present

## 2020-07-16 ENCOUNTER — Other Ambulatory Visit: Payer: Self-pay | Admitting: Physician Assistant

## 2020-07-16 DIAGNOSIS — Z1231 Encounter for screening mammogram for malignant neoplasm of breast: Secondary | ICD-10-CM

## 2020-09-06 ENCOUNTER — Ambulatory Visit (LOCAL_COMMUNITY_HEALTH_CENTER): Payer: Self-pay

## 2020-09-06 ENCOUNTER — Other Ambulatory Visit: Payer: Self-pay

## 2020-09-06 DIAGNOSIS — Z111 Encounter for screening for respiratory tuberculosis: Secondary | ICD-10-CM

## 2020-09-09 ENCOUNTER — Ambulatory Visit (LOCAL_COMMUNITY_HEALTH_CENTER): Payer: Self-pay

## 2020-09-09 ENCOUNTER — Other Ambulatory Visit: Payer: Self-pay

## 2020-09-09 DIAGNOSIS — Z111 Encounter for screening for respiratory tuberculosis: Secondary | ICD-10-CM

## 2020-09-09 LAB — TB SKIN TEST
Induration: 0 mm
TB Skin Test: NEGATIVE

## 2020-12-06 ENCOUNTER — Other Ambulatory Visit: Payer: Self-pay

## 2020-12-06 ENCOUNTER — Ambulatory Visit
Admission: RE | Admit: 2020-12-06 | Discharge: 2020-12-06 | Disposition: A | Discharge status: Home / Self Care | Payer: BC Managed Care – PPO | Source: Ambulatory Visit | Attending: Physician Assistant | Admitting: Physician Assistant

## 2020-12-06 DIAGNOSIS — Z1231 Encounter for screening mammogram for malignant neoplasm of breast: Secondary | ICD-10-CM

## 2021-07-19 ENCOUNTER — Other Ambulatory Visit: Payer: Self-pay | Admitting: Physician Assistant

## 2021-07-19 DIAGNOSIS — Z1231 Encounter for screening mammogram for malignant neoplasm of breast: Secondary | ICD-10-CM

## 2021-12-07 ENCOUNTER — Ambulatory Visit
Admission: RE | Admit: 2021-12-07 | Discharge: 2021-12-07 | Disposition: A | Discharge status: Home / Self Care | Payer: BC Managed Care – PPO | Source: Ambulatory Visit | Attending: Physician Assistant | Admitting: Physician Assistant

## 2021-12-07 DIAGNOSIS — Z1231 Encounter for screening mammogram for malignant neoplasm of breast: Secondary | ICD-10-CM | POA: Insufficient documentation

## 2022-04-10 ENCOUNTER — Ambulatory Visit (INDEPENDENT_AMBULATORY_CARE_PROVIDER_SITE_OTHER): Payer: Self-pay | Admitting: Physician Assistant

## 2022-04-10 ENCOUNTER — Encounter: Payer: Self-pay | Admitting: Physician Assistant

## 2022-04-10 VITALS — BP 120/82 | HR 72 | Temp 97.4°F | Wt 190.6 lb

## 2022-04-10 DIAGNOSIS — J069 Acute upper respiratory infection, unspecified: Secondary | ICD-10-CM

## 2022-04-10 NOTE — Progress Notes (Signed)
[facility] [address] [place], [place] [address]   Office Visit Note  Patient Name: Charlotte Tucker Date of Birth [id number]  Medical Record number [id number]  Date of Service: 04/10/2022  Chief Complaint  Patient presents with   Sore Throat    Started last night. Throat is sore and roof of mouth itches. No fever or BA     66 y/o F presents to the clinic for c/o sore throat, itchy mouth, sneezing, and rhinorrhea since earlier today. She was outside in the cold and rain working. She denies fever, body aches, CP, SOB or known exposure to strep or flu  Sore Throat  Pertinent negatives include no congestion, ear pain or trouble swallowing.      Current Medication:  Outpatient Encounter Medications as of 04/10/2022  Medication Sig   ACCU-CHEK GUIDE test strip USE STRIP TO CHECK BLOOD SUGAR THREE TIMES DAILY AS INSTRUCTED   Accu-Chek Softclix Lancets lancets 3 (three) times daily.   aspirin EC 81 MG tablet Take 81 mg by mouth daily.    losartan-hydrochlorothiazide (HYZAAR) 100-12.5 MG tablet take 1 tablet by mouth once daily   pravastatin (PRAVACHOL) 10 MG tablet Take 10 mg by mouth at bedtime.   spironolactone (ALDACTONE) 25 MG tablet take 1 tablet by mouth once daily   Vitamin D, Ergocalciferol, (DRISDOL) 50000 units CAPS capsule take 1 capsule by mouth every week   amoxicillin-clavulanate (AUGMENTIN) 875-125 MG tablet Take 1 tablet by mouth 2 (two) times daily. (Patient not taking: Reported on 09/06/2020)   fluticasone (FLONASE) 50 MCG/ACT nasal spray Place 1 spray into both nostrils daily.   Phenyleph-CPM-DM-Aspirin (ALKA-SELTZER PLUS COLD & COUGH PO) Take by mouth. (Patient not taking: Reported on 09/06/2020)   sodium chloride (OCEAN) 0.65 % SOLN nasal spray Place 2 sprays into both nostrils every 2 (two) hours while awake.   No facility-administered encounter medications on file as of 04/10/2022.      Medical History: No past medical history on file.   Vital  Signs: BP 120/82 (BP Location: Left Arm, Patient Position: Sitting, Cuff Size: Normal)   Pulse 72   Temp (!) 97.4 F (36.3 C) (Tympanic)   Wt 190 lb 9.6 oz (86.5 kg)   SpO2 98%   BMI 33.76 kg/m    Review of Systems  Constitutional: Negative.   HENT:  Positive for rhinorrhea and sore throat. Negative for congestion, ear pain, postnasal drip and trouble swallowing.   Respiratory: Negative.    Cardiovascular: Negative.   Neurological: Negative.     Physical Exam Constitutional:      Appearance: Normal appearance.  HENT:     Head: Atraumatic.     Right Ear: Tympanic membrane, ear canal and external ear normal.     Left Ear: Tympanic membrane, ear canal and external ear normal.     Nose: Nose normal.     Mouth/Throat:     Mouth: Mucous membranes are moist.     Pharynx: Oropharynx is clear.  Eyes:     Extraocular Movements: Extraocular movements intact.  Cardiovascular:     Rate and Rhythm: Normal rate and regular rhythm.  Pulmonary:     Effort: Pulmonary effort is normal.     Breath sounds: Normal breath sounds.  Musculoskeletal:     Cervical back: Neck supple.  Skin:    General: Skin is warm.  Neurological:     Mental Status: She is alert.  Psychiatric:        Mood and Affect: Mood  normal.        Behavior: Behavior normal.        Thought Content: Thought content normal.        Judgment: Judgment normal.       Assessment/Plan:  1. Viral upper respiratory tract infection  Increase fluids  Take otc oral anti-histamine to help with symptoms If throat pain worsens then start taking NSAIDs as directed on the bottle RTC if symptoms don't improve or worsen.  Pt verbalized understanding and in agreement.   General Counseling: luba matzen understanding of the findings of todays visit and agrees with plan of treatment. I have discussed any further diagnostic evaluation that may be needed or ordered today. We also reviewed her medications today. she has been  encouraged to call the office with any questions or concerns that should arise related to todays visit.    Time spent:30 Minutes    Gilberto Better, [place] Physician Assistant

## 2022-05-07 IMAGING — MG MM DIGITAL SCREENING BILAT W/ TOMO AND CAD
8 series · 8 of 24 positions shown · non-contrast
Comparison: Previous exam(s).

CLINICAL DATA: Screening.

EXAM:
DIGITAL SCREENING BILATERAL MAMMOGRAM WITH TOMOSYNTHESIS AND CAD
TECHNIQUE: Bilateral screening digital craniocaudal and mediolateral oblique
mammograms were obtained. Bilateral screening digital breast
tomosynthesis was performed. The images were evaluated with
computer-aided detection.

[R MLO synth-2D]
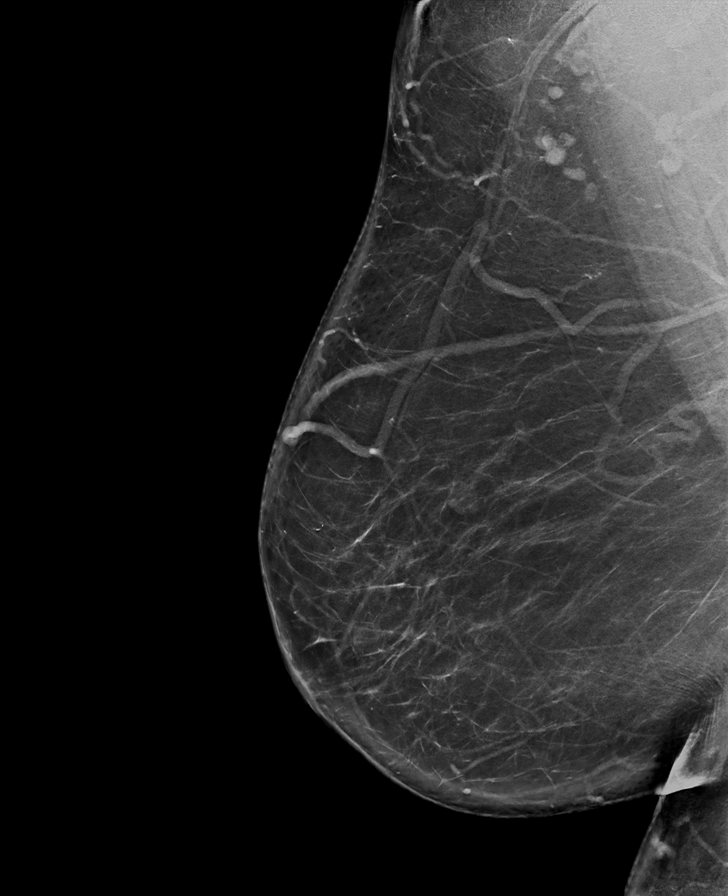

[L CC synth-2D]
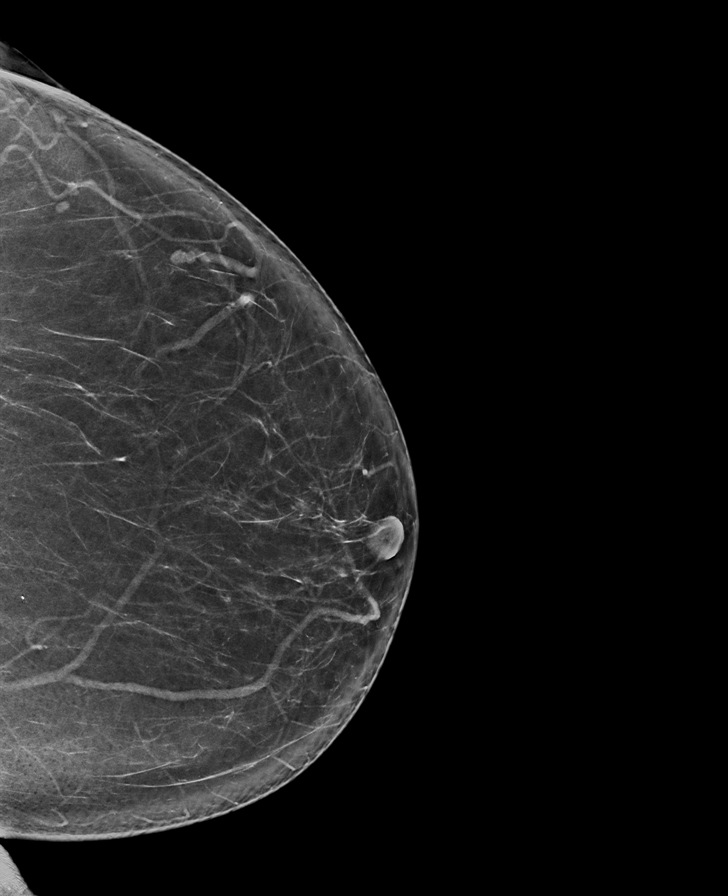

[L MLO synth-2D]
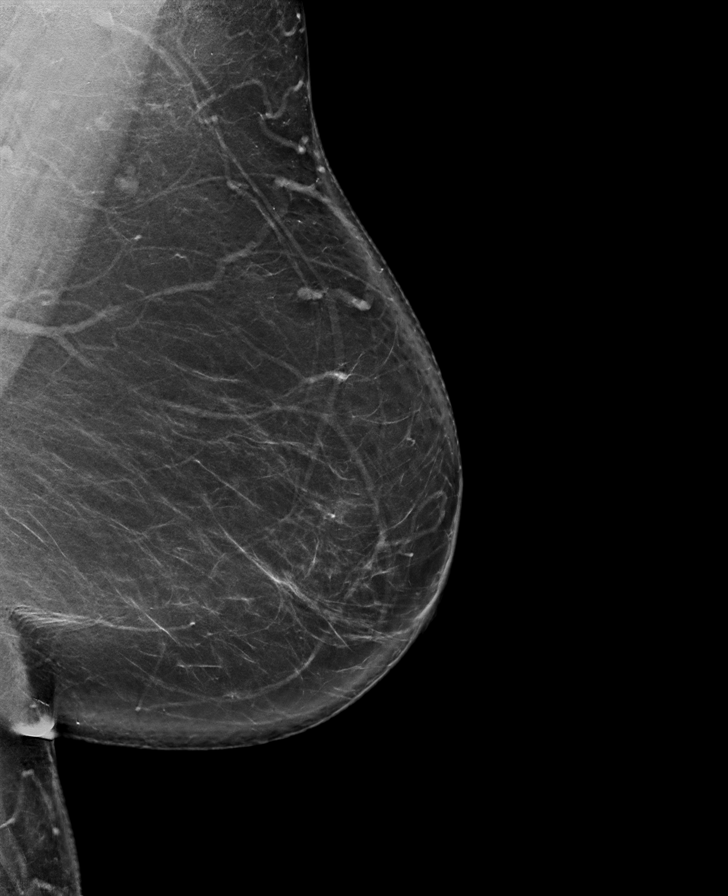

[R CC synth-2D]
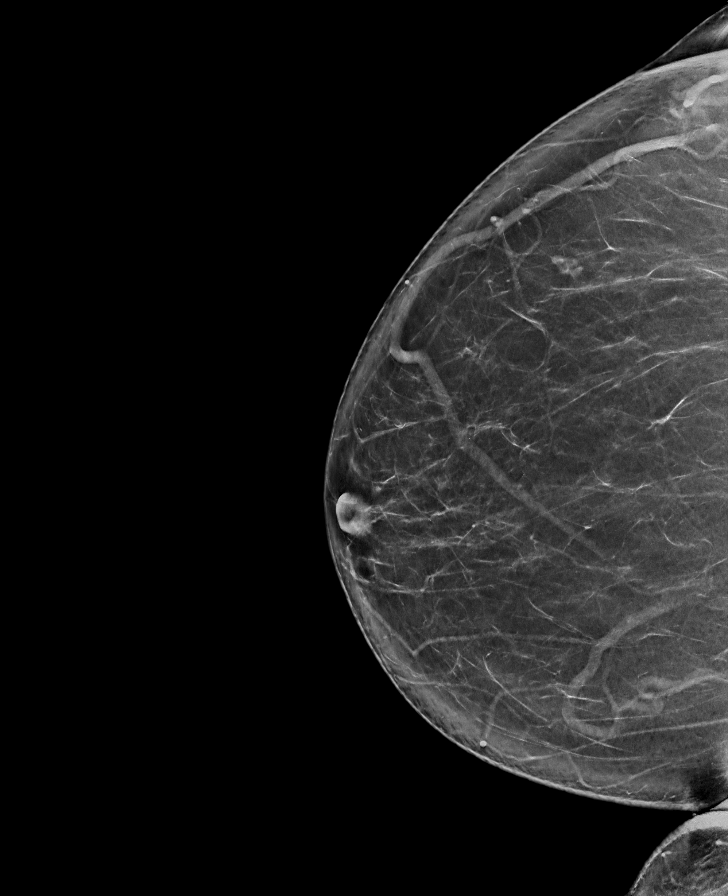

[R MLO tomo · tomo slice 51/100.0]
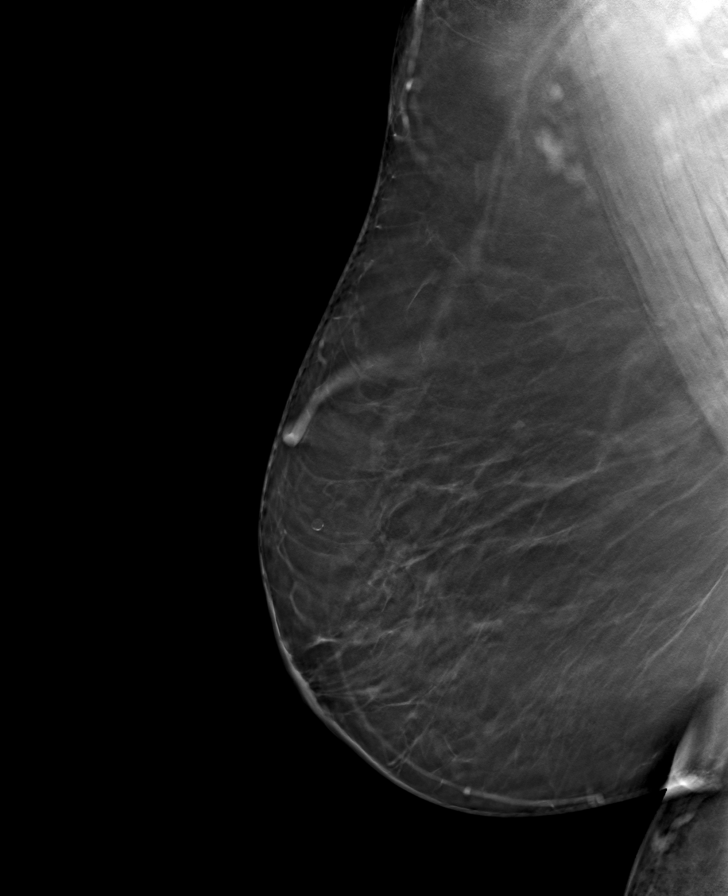

[L MLO tomo · tomo slice 49/97.0]
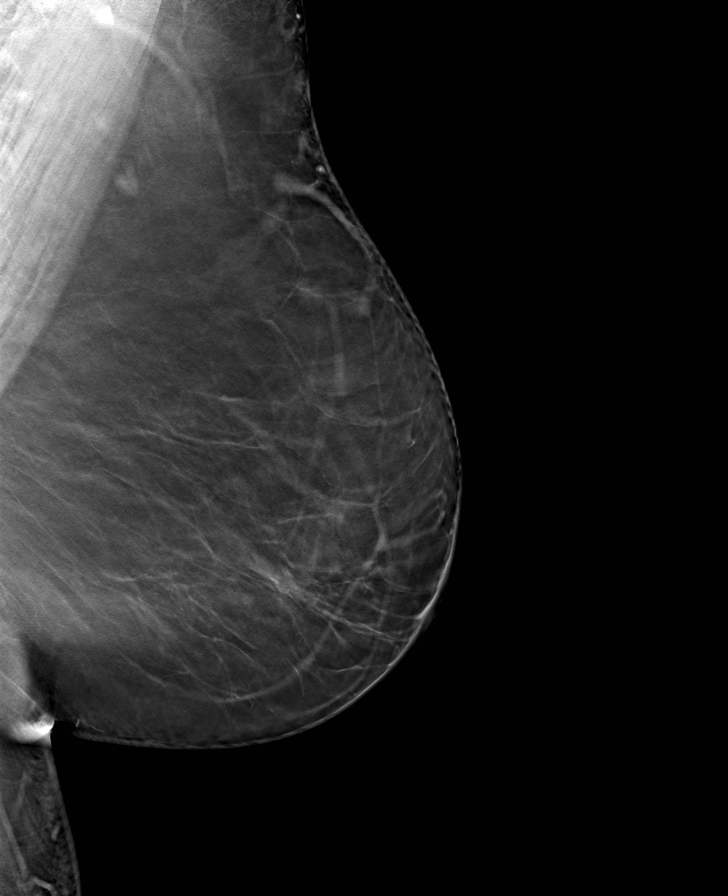

[L CC tomo · tomo slice 43/84.0]
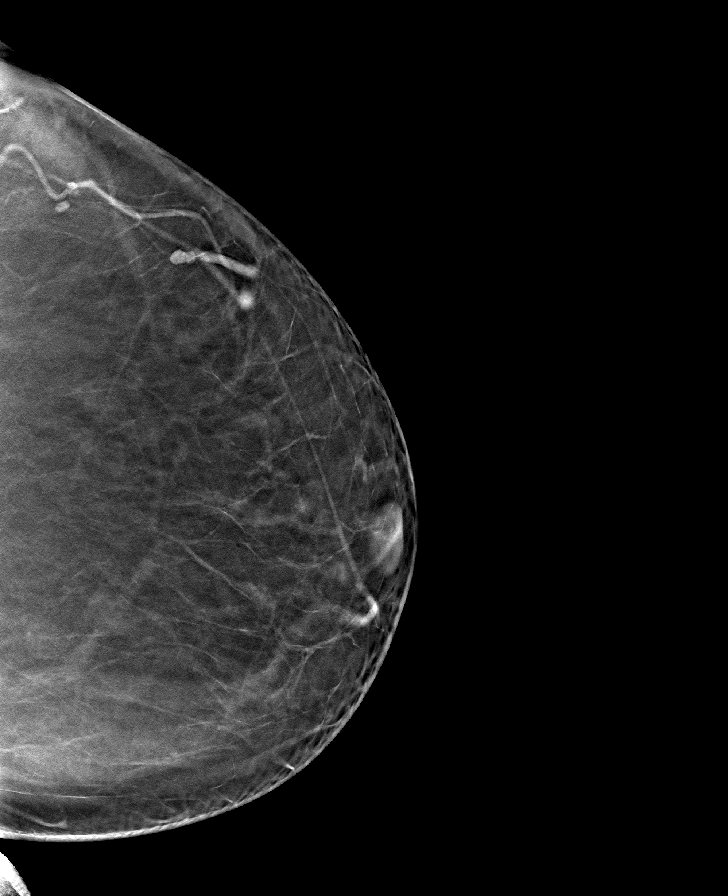

[R CC tomo · tomo slice 45/88.0]
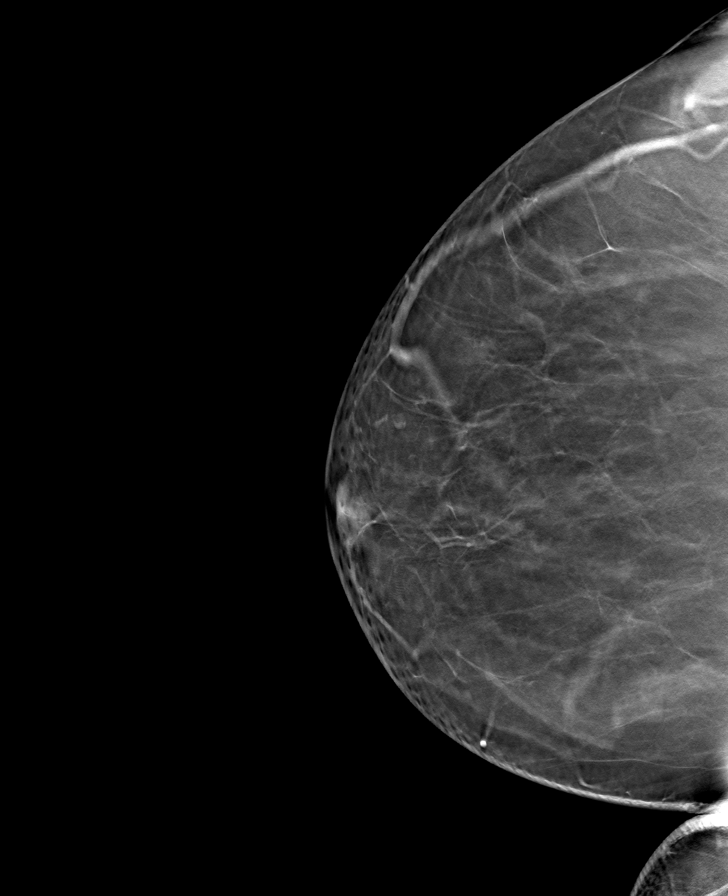

[8 of 24 positions shown; findings below may reference images not displayed]

ACR Breast Density Category b: There are scattered areas of
fibroglandular density.
FINDINGS: There are no findings suspicious for malignancy.
IMPRESSION: No mammographic evidence of malignancy. A result letter of this
screening mammogram will be mailed directly to the patient.

RECOMMENDATION:
Screening mammogram in one year. (Code:51-O-LD2)

BI-RADS CATEGORY  1: Negative.

## 2022-05-17 ENCOUNTER — Encounter: Payer: Self-pay | Admitting: Adult Health

## 2022-05-17 ENCOUNTER — Ambulatory Visit (INDEPENDENT_AMBULATORY_CARE_PROVIDER_SITE_OTHER): Payer: Self-pay | Admitting: Adult Health

## 2022-05-17 VITALS — BP 124/78 | HR 73 | Temp 99.8°F | Wt 190.0 lb

## 2022-05-17 DIAGNOSIS — U071 COVID-19: Secondary | ICD-10-CM

## 2022-05-17 LAB — POC COVID19 BINAXNOW: SARS Coronavirus 2 Ag: POSITIVE — AB

## 2022-05-17 NOTE — Progress Notes (Signed)
[facility] [address], [place] [address]   Office Visit Note  Patient Name: Charlotte Tucker Date of Birth [id number]  Medical Record number [id number]  Date of Service: 05/17/2022  Chief Complaint  Patient presents with   Covid Exposure    Was on [place] trip and a couple people from that trip has tested Pos. Started feeling bad Sat. Headache, cough, weakness in legs.      HPI Pt is here for a sick visit. She reports 5 days ago she started feeling weak, cough, and some chills.  Denies sore throat, or ear pain.  She has taken some tylenol with good improvement.    Current Medication:  Outpatient Encounter Medications as of 05/17/2022  Medication Sig   ACCU-CHEK GUIDE test strip USE STRIP TO CHECK BLOOD SUGAR THREE TIMES DAILY AS INSTRUCTED   Accu-Chek Softclix Lancets lancets 3 (three) times daily.   aspirin EC 81 MG tablet Take 81 mg by mouth daily.    losartan-hydrochlorothiazide (HYZAAR) 100-12.5 MG tablet take 1 tablet by mouth once daily   pravastatin (PRAVACHOL) 10 MG tablet Take 10 mg by mouth at bedtime.   spironolactone (ALDACTONE) 25 MG tablet take 1 tablet by mouth once daily   Vitamin D, Ergocalciferol, (DRISDOL) 50000 units CAPS capsule take 1 capsule by mouth every week   amoxicillin-clavulanate (AUGMENTIN) 875-125 MG tablet Take 1 tablet by mouth 2 (two) times daily. (Patient not taking: Reported on 09/06/2020)   fluticasone (FLONASE) 50 MCG/ACT nasal spray Place 1 spray into both nostrils daily.   Phenyleph-CPM-DM-Aspirin (ALKA-SELTZER PLUS COLD & COUGH PO) Take by mouth. (Patient not taking: Reported on 09/06/2020)   sodium chloride (OCEAN) 0.65 % SOLN nasal spray Place 2 sprays into both nostrils every 2 (two) hours while awake.   No facility-administered encounter medications on file as of 05/17/2022.      Medical History: No past medical history on file.   Vital Signs: BP 124/78 (BP Location: Left Arm, Patient Position: Sitting, Cuff Size:  Normal)   Pulse 73   Temp 99.8 F (37.7 C) (Tympanic)   Wt 190 lb (86.2 kg)   SpO2 98%   BMI 33.66 kg/m    Review of Systems  Constitutional:  Positive for chills. Negative for fever.  HENT:  Positive for sinus pressure.   Eyes:  Negative for pain and itching.  Respiratory:  Positive for cough.   Cardiovascular:  Negative for chest pain.  Gastrointestinal:  Negative for diarrhea, nausea and vomiting.    Physical Exam Vitals and nursing note reviewed.  Constitutional:      Appearance: Normal appearance.  HENT:     Head: Normocephalic.     Right Ear: Tympanic membrane and ear canal normal.     Left Ear: Tympanic membrane and ear canal normal.     Mouth/Throat:     Mouth: Mucous membranes are moist.  Pulmonary:     Effort: Pulmonary effort is normal.     Breath sounds: Normal breath sounds.  Lymphadenopathy:     Cervical: No cervical adenopathy.  Neurological:     Mental Status: She is alert.    Results for orders placed or performed in visit on 05/17/22 (from the past 24 hour(s))  POC COVID-19     Status: Abnormal   Collection Time: 05/17/22  7:54 AM  Result Value Ref Range   SARS Coronavirus 2 Ag Positive (A) Negative    Assessment/Plan: 1. COVID-19  You should wear a well-fitting mask (ideally a  N95 or [id number]) anytime you must be outside your home for the initial 5 days of symptoms. After the initial 5 days, you may resume activities as you feel appropriate as long as you continue to wear a mask.  Rest, and drink plenty of water.  Use cough drops, gargle warm sal water or drink wam liquids (like tea with honey) as needed for cough/throat irritation.   Take over-the-counter medicines (such as Dayquil or Nyquil) as discussed at your visit to help manage your symptoms.  Send a MyChart message to the provider or schedule a return appointment as needed for new/worsening symptoms (especially shortness of breath or chest pain) or if symptoms not improving with recommended  treatment over the next 5-7 days.         General Counseling: korie streat understanding of the findings of todays visit and agrees with plan of treatment. I have discussed any further diagnostic evaluation that may be needed or ordered today. We also reviewed her medications today. she has been encouraged to call the office with any questions or concerns that should arise related to todays visit.   Orders Placed This Encounter  Procedures   POC COVID-19    No orders of the defined types were placed in this encounter.   Time spent:20 Minutes    Kendell Bane AGNP-C Nurse Practitioner

## 2022-07-05 ENCOUNTER — Other Ambulatory Visit: Payer: Self-pay | Admitting: Physician Assistant

## 2022-07-05 DIAGNOSIS — Z1231 Encounter for screening mammogram for malignant neoplasm of breast: Secondary | ICD-10-CM

## 2022-12-12 ENCOUNTER — Ambulatory Visit
Admission: RE | Admit: 2022-12-12 | Discharge: 2022-12-12 | Disposition: A | Discharge status: Home / Self Care | Payer: Medicare HMO | Source: Ambulatory Visit | Attending: Physician Assistant | Admitting: Physician Assistant

## 2022-12-12 DIAGNOSIS — Z1231 Encounter for screening mammogram for malignant neoplasm of breast: Secondary | ICD-10-CM | POA: Insufficient documentation

## 2023-02-16 ENCOUNTER — Other Ambulatory Visit: Payer: Self-pay | Admitting: Internal Medicine

## 2023-02-16 DIAGNOSIS — Z13820 Encounter for screening for osteoporosis: Secondary | ICD-10-CM

## 2023-05-24 ENCOUNTER — Other Ambulatory Visit: Payer: Medicare HMO

## 2023-06-07 ENCOUNTER — Ambulatory Visit
Admission: RE | Admit: 2023-06-07 | Discharge: 2023-06-07 | Disposition: A | Discharge status: Home / Self Care | Payer: Medicare HMO | Source: Ambulatory Visit | Attending: Internal Medicine | Admitting: Internal Medicine

## 2023-06-07 DIAGNOSIS — Z780 Asymptomatic menopausal state: Secondary | ICD-10-CM | POA: Diagnosis not present

## 2023-06-07 DIAGNOSIS — Z13820 Encounter for screening for osteoporosis: Secondary | ICD-10-CM | POA: Diagnosis present

## 2023-11-05 ENCOUNTER — Other Ambulatory Visit: Payer: Self-pay | Admitting: Internal Medicine

## 2023-11-05 DIAGNOSIS — Z1231 Encounter for screening mammogram for malignant neoplasm of breast: Secondary | ICD-10-CM

## 2023-12-13 ENCOUNTER — Ambulatory Visit
Admission: RE | Admit: 2023-12-13 | Discharge: 2023-12-13 | Disposition: A | Discharge status: Home / Self Care | Source: Ambulatory Visit | Attending: Internal Medicine | Admitting: Internal Medicine

## 2023-12-13 DIAGNOSIS — Z1231 Encounter for screening mammogram for malignant neoplasm of breast: Secondary | ICD-10-CM | POA: Diagnosis present
